# Patient Record
Sex: Female | Born: 1965 | Race: Black or African American | Hispanic: No | Smoking: Former smoker
Health system: Southern US, Community
[De-identification: ages and names within clinical notes are randomized; demographics above are authoritative.]

## PROBLEM LIST (undated history)

## (undated) DIAGNOSIS — Z9289 Personal history of other medical treatment: Secondary | ICD-10-CM

## (undated) DIAGNOSIS — J45909 Unspecified asthma, uncomplicated: Secondary | ICD-10-CM

## (undated) HISTORY — PX: COLONOSCOPY: SHX174

## (undated) HISTORY — PX: HEMORRHOID SURGERY: SHX153

## (undated) HISTORY — PX: TUBAL LIGATION: SHX77

## (undated) HISTORY — PX: ABDOMINAL HYSTERECTOMY: SHX81

---

## 2019-11-05 ENCOUNTER — Emergency Department
Admission: EM | Admit: 2019-11-05 | Discharge: 2019-11-05 | Disposition: A | Discharge status: Home / Self Care | Payer: Self-pay | Attending: Emergency Medicine | Admitting: Emergency Medicine

## 2019-11-05 ENCOUNTER — Other Ambulatory Visit: Payer: Self-pay

## 2019-11-05 ENCOUNTER — Encounter: Payer: Self-pay | Admitting: Emergency Medicine

## 2019-11-05 DIAGNOSIS — Z87891 Personal history of nicotine dependence: Secondary | ICD-10-CM | POA: Insufficient documentation

## 2019-11-05 DIAGNOSIS — R319 Hematuria, unspecified: Secondary | ICD-10-CM | POA: Insufficient documentation

## 2019-11-05 DIAGNOSIS — J45909 Unspecified asthma, uncomplicated: Secondary | ICD-10-CM | POA: Insufficient documentation

## 2019-11-05 HISTORY — DX: Unspecified asthma, uncomplicated: J45.909

## 2019-11-05 HISTORY — DX: Personal history of other medical treatment: Z92.89

## 2019-11-05 LAB — CBC
HCT: 37.8 % (ref 36.0–46.0)
Hemoglobin: 13.7 g/dL (ref 12.0–15.0)
MCH: 30.1 pg (ref 26.0–34.0)
MCHC: 36.2 g/dL — ABNORMAL HIGH (ref 30.0–36.0)
MCV: 83.1 fL (ref 80.0–100.0)
Platelets: 213 10*3/uL (ref 150–400)
RBC: 4.55 MIL/uL (ref 3.87–5.11)
RDW: 13.8 % (ref 11.5–15.5)
WBC: 4.5 10*3/uL (ref 4.0–10.5)
nRBC: 0 % (ref 0.0–0.2)

## 2019-11-05 LAB — URINALYSIS, COMPLETE (UACMP) WITH MICROSCOPIC
Bacteria, UA: NONE SEEN
Bilirubin Urine: NEGATIVE
Glucose, UA: NEGATIVE mg/dL
Ketones, ur: NEGATIVE mg/dL
Leukocytes,Ua: NEGATIVE
Nitrite: NEGATIVE
Protein, ur: 100 mg/dL — AB
RBC / HPF: >50 RBC/hpf — ABNORMAL HIGH (ref 0–5)
Specific Gravity, Urine: 1.032 — ABNORMAL HIGH (ref 1.005–1.030)
pH: 5 (ref 5.0–8.0)

## 2019-11-05 LAB — BASIC METABOLIC PANEL
Anion gap: 7 (ref 5–15)
BUN: 14 mg/dL (ref 6–20)
CO2: 31 mmol/L (ref 22–32)
Calcium: 9.3 mg/dL (ref 8.9–10.3)
Chloride: 104 mmol/L (ref 98–111)
Creatinine, Ser: 0.85 mg/dL (ref 0.44–1.00)
GFR calc Af Amer: >60 mL/min (ref >60)
GFR calc non Af Amer: >60 mL/min (ref >60)
Glucose, Bld: 124 mg/dL — ABNORMAL HIGH (ref 70–99)
Potassium: 4.1 mmol/L (ref 3.5–5.1)
Sodium: 142 mmol/L (ref 135–145)

## 2019-11-05 NOTE — Discharge Instructions (Addendum)
We suspect that the blood in the urine is due to a kidney stone, as you have no signs of a urinary tract infection.  Make sure you are drinking plenty of water, and take Tylenol or ibuprofen as needed for pain.  Return to the ER for new, worsening, or persistent severe abdominal or flank pain, fever or chills, vomiting, weakness, or if you have persistent bleeding.  If you have continued mild bleeding, you should follow-up with your primary care doctor within the next 1 to 2 weeks.

## 2019-11-05 NOTE — ED Provider Notes (Signed)
[facility] Emergency Department Provider Note ____________________________________________   First MD Initiated Contact with Patient 11/05/19 2114     (approximate)  I have reviewed the triage vital signs and the nursing notes.   HISTORY  Chief Complaint Hematuria    HPI Rebecca Mooney is a 54 y.o. female with PMH as noted below who presents with hematuria, acute onset yesterday, intermittent since then, and described as dark brown urine.  She also reports mild left flank pain since yesterday but has not needed to take anything for it.  She states that she has had kidney stones in the past, but also was concerned she could have a UTI.  She denies any urinary frequency, dysuria, or any fever or chills.  Past Medical History:  Diagnosis Date  . Asthma due to seasonal allergies   . History of blood transfusion     There are no problems to display for this patient.   Past Surgical History:  Procedure Laterality Date  . ABDOMINAL HYSTERECTOMY    . COLONOSCOPY    . HEMORRHOID SURGERY    . TUBAL LIGATION      Prior to Admission medications   Not on File    Allergies Patient has no known allergies.  History reviewed. No pertinent family history.  Social History Social History   Tobacco Use  . Smoking status: Former Games developer  . Smokeless tobacco: Never Used  Substance Use Topics  . Alcohol use: Yes    Comment: Occ wine  . Drug use: Never    Review of Systems  Constitutional: No fever/chills. Eyes: No visual changes. ENT: No sore throat. Cardiovascular: Denies chest pain. Respiratory: Denies shortness of breath. Gastrointestinal: No vomiting or diarrhea.  Genitourinary: Negative for dysuria.  Positive for hematuria. Musculoskeletal: Negative for back pain. Skin: Negative for rash. Neurological: Negative for headaches, focal weakness or numbness.   ____________________________________________   PHYSICAL EXAM:  VITAL SIGNS: ED  Triage Vitals  Enc Vitals Group     BP 11/05/19 1609 131/80     Pulse Rate 11/05/19 1609 83     Resp 11/05/19 1609 20     Temp 11/05/19 1609 98.4 F (36.9 C)     Temp Source 11/05/19 1609 Oral     SpO2 11/05/19 1609 97 %     Weight 11/05/19 1610 143 lb (64.9 kg)     Height 11/05/19 1610 4\' 11"  (1.499 m)     Head Circumference --      Peak Flow --      Pain Score 11/05/19 1610 3     Pain Loc --      Pain Edu? --      Excl. in GC? --     Constitutional: Alert and oriented. Well appearing and in no acute distress. Eyes: Conjunctivae are normal.  Head: Atraumatic. Nose: No congestion/rhinnorhea. Mouth/Throat: Mucous membranes are moist.   Neck: Normal range of motion.  Cardiovascular: Normal rate, regular rhythm. Good peripheral circulation. Respiratory: Normal respiratory effort.  No retractions.  Gastrointestinal: No distention.  Musculoskeletal: Extremities warm and well perfused.  Neurologic:  Normal speech and language. No gross focal neurologic deficits are appreciated.  Skin:  Skin is warm and dry. No rash noted. Psychiatric: Mood and affect are normal. Speech and behavior are normal.  ____________________________________________   LABS (all labs ordered are listed, but only abnormal results are displayed)  Labs Reviewed  URINALYSIS, COMPLETE (UACMP) WITH MICROSCOPIC - Abnormal; Notable for the following components:  Result Value   Color, Urine AMBER (*)    APPearance CLOUDY (*)    Specific Gravity, Urine 1.032 (*)    Hgb urine dipstick LARGE (*)    Protein, ur 100 (*)    RBC / HPF >50 (*)    All other components within normal limits  BASIC METABOLIC PANEL - Abnormal; Notable for the following components:   Glucose, Bld 124 (*)    All other components within normal limits  CBC - Abnormal; Notable for the following components:   MCHC 36.2 (*)    All other components within normal limits    ____________________________________________  EKG   ____________________________________________  RADIOLOGY    ____________________________________________   PROCEDURES  Procedure(s) performed: No  Procedures  Critical Care performed: No ____________________________________________   INITIAL IMPRESSION / ASSESSMENT AND PLAN / ED COURSE  Pertinent labs & imaging results that were available during my care of the patient were reviewed by me and considered in my medical decision making (see chart for details).  54 year old female with PMH as noted above including a prior history of kidney stones presents with gross hematuria since yesterday associated with mild left flank pain.  On exam she is overall very well-appearing.  Her vital signs are normal.  Physical exam is unremarkable.  Lab work-up obtained from triage is unremarkable except for UA showing RBCs.  There are no WBCs, bacteria, or other abnormal findings.  Overall presentation favors a kidney stone.  There is no clinical or lab evidence for UTI.  Given that the patient has had kidney stones previously and her pain is mild and not requiring any medication, there is also no indication for imaging.  I counseled the patient on the results of the work-up.  She feels comfortable and would like to go home.  Return precautions given, and she expresses understanding.  ____________________________________________   FINAL CLINICAL IMPRESSION(S) / ED DIAGNOSES  Final diagnoses:  Hematuria, unspecified type      NEW MEDICATIONS STARTED DURING THIS VISIT:  There are no discharge medications for this patient.    Note:  This document was prepared using Dragon voice recognition software and may include unintentional dictation errors.   Dionne Bucy, MD 11/05/19 2215

## 2019-11-05 NOTE — ED Triage Notes (Signed)
Pt presents to ED via POV with c/o hematuria. Pt states saw dark brown urine yesterday, states some today but not as bad. Pt states mild pain to R flank at this time.

## 2019-11-16 ENCOUNTER — Emergency Department: Payer: Medicaid Other

## 2019-11-16 ENCOUNTER — Emergency Department
Admission: EM | Admit: 2019-11-16 | Discharge: 2019-11-16 | Disposition: A | Discharge status: Home / Self Care | Payer: Medicaid Other | Attending: Student in an Organized Health Care Education/Training Program | Admitting: Student in an Organized Health Care Education/Training Program

## 2019-11-16 ENCOUNTER — Other Ambulatory Visit: Payer: Self-pay

## 2019-11-16 DIAGNOSIS — N200 Calculus of kidney: Secondary | ICD-10-CM | POA: Insufficient documentation

## 2019-11-16 DIAGNOSIS — J45909 Unspecified asthma, uncomplicated: Secondary | ICD-10-CM | POA: Insufficient documentation

## 2019-11-16 DIAGNOSIS — R109 Unspecified abdominal pain: Secondary | ICD-10-CM

## 2019-11-16 DIAGNOSIS — Z87891 Personal history of nicotine dependence: Secondary | ICD-10-CM | POA: Insufficient documentation

## 2019-11-16 LAB — URINALYSIS, COMPLETE (UACMP) WITH MICROSCOPIC
Bacteria, UA: NONE SEEN
Bilirubin Urine: NEGATIVE
Glucose, UA: 50 mg/dL — AB
Hgb urine dipstick: NEGATIVE
Ketones, ur: NEGATIVE mg/dL
Leukocytes,Ua: NEGATIVE
Nitrite: NEGATIVE
Protein, ur: NEGATIVE mg/dL
Specific Gravity, Urine: 1.015 (ref 1.005–1.030)
pH: 5 (ref 5.0–8.0)

## 2019-11-16 LAB — CBC
HCT: 40.6 % (ref 36.0–46.0)
Hemoglobin: 14.2 g/dL (ref 12.0–15.0)
MCH: 29.8 pg (ref 26.0–34.0)
MCHC: 35 g/dL (ref 30.0–36.0)
MCV: 85.1 fL (ref 80.0–100.0)
Platelets: 251 10*3/uL (ref 150–400)
RBC: 4.77 MIL/uL (ref 3.87–5.11)
RDW: 13.7 % (ref 11.5–15.5)
WBC: 4 10*3/uL (ref 4.0–10.5)
nRBC: 0 % (ref 0.0–0.2)

## 2019-11-16 LAB — BASIC METABOLIC PANEL
Anion gap: 10 (ref 5–15)
BUN: 17 mg/dL (ref 6–20)
CO2: 24 mmol/L (ref 22–32)
Calcium: 9.7 mg/dL (ref 8.9–10.3)
Chloride: 101 mmol/L (ref 98–111)
Creatinine, Ser: 0.84 mg/dL (ref 0.44–1.00)
GFR calc Af Amer: >60 mL/min (ref >60)
GFR calc non Af Amer: >60 mL/min (ref >60)
Glucose, Bld: 128 mg/dL — ABNORMAL HIGH (ref 70–99)
Potassium: 4.3 mmol/L (ref 3.5–5.1)
Sodium: 135 mmol/L (ref 135–145)

## 2019-11-16 MED ORDER — MORPHINE SULFATE (PF) 4 MG/ML IV SOLN
4.0000 mg | INTRAVENOUS | Status: DC | PRN
  Administered 2019-11-16: 4 mg via INTRAVENOUS
  Filled 2019-11-16: qty 1

## 2019-11-16 MED ORDER — KETOROLAC TROMETHAMINE 30 MG/ML IJ SOLN
15.0000 mg | Freq: Once | INTRAMUSCULAR | Status: AC
  Administered 2019-11-16: 15 mg via INTRAVENOUS
  Filled 2019-11-16: qty 1

## 2019-11-16 MED ORDER — ONDANSETRON HCL 4 MG/2ML IJ SOLN
4.0000 mg | Freq: Once | INTRAMUSCULAR | Status: AC
  Administered 2019-11-16: 4 mg via INTRAVENOUS
  Filled 2019-11-16: qty 2

## 2019-11-16 MED ORDER — HYDROCODONE-ACETAMINOPHEN 5-325 MG PO TABS: 1.0000 | ORAL_TABLET | ORAL | 0 refills | Status: AC | PRN

## 2019-11-16 MED ORDER — ONDANSETRON HCL 4 MG PO TABS: 4.0000 mg | ORAL_TABLET | Freq: Every day | ORAL | 0 refills | Status: AC | PRN

## 2019-11-16 MED ORDER — TAMSULOSIN HCL 0.4 MG PO CAPS: 0.4000 mg | ORAL_CAPSULE | Freq: Every day | ORAL | 0 refills | Status: AC

## 2019-11-16 NOTE — ED Provider Notes (Signed)
[facility] Emergency Department Provider Note    First MD Initiated Contact with Patient 11/16/19 1659     (approximate)  I have reviewed the triage vital signs and the nursing notes.   HISTORY  Chief Complaint Flank Pain    HPI Shernell Saldierna is a 54 y.o. female with a history of kidney stones presents to the ER for evaluation of acute onset of right flank pain radiating to her groin.  States she was having some hematuria earlier but is since cleared.  Denies any fevers.  Is having nausea.  Rates the pain is moderate to severe.    Past Medical History:  Diagnosis Date  . Asthma due to seasonal allergies   . History of blood transfusion    No family history on file. Past Surgical History:  Procedure Laterality Date  . ABDOMINAL HYSTERECTOMY    . COLONOSCOPY    . HEMORRHOID SURGERY    . TUBAL LIGATION     There are no problems to display for this patient.     Prior to Admission medications   Medication Sig Start Date End Date Taking? Authorizing Provider  HYDROcodone-acetaminophen (NORCO) 5-325 MG tablet Take 1 tablet by mouth every 4 (four) hours as needed for moderate pain. 11/16/19   Willy Eddy, MD  ondansetron (ZOFRAN) 4 MG tablet Take 1 tablet (4 mg total) by mouth daily as needed. 11/16/19 11/15/20  Willy Eddy, MD  tamsulosin (FLOMAX) 0.4 MG CAPS capsule Take 1 capsule (0.4 mg total) by mouth daily after supper. 11/16/19   Willy Eddy, MD    Allergies Patient has no known allergies.    Social History Social History   Tobacco Use  . Smoking status: Former Games developer  . Smokeless tobacco: Never Used  Substance Use Topics  . Alcohol use: Yes    Comment: Occ wine  . Drug use: Never    Review of Systems Patient denies headaches, rhinorrhea, blurry vision, numbness, shortness of breath, chest pain, edema, cough, abdominal pain, nausea, vomiting, diarrhea, dysuria, fevers, rashes or hallucinations unless otherwise  stated above in HPI. ____________________________________________   PHYSICAL EXAM:  VITAL SIGNS: Vitals:   11/16/19 1348  BP: (!) 127/91  Pulse: 82  Resp: 18  Temp: 98.6 F (37 C)  SpO2: 100%    Constitutional: Alert and oriented.  Eyes: Conjunctivae are normal.  Head: Atraumatic. Nose: No congestion/rhinnorhea. Mouth/Throat: Mucous membranes are moist.   Neck: No stridor. Painless ROM.  Cardiovascular: Normal rate, regular rhythm. Grossly normal heart sounds.  Good peripheral circulation. Respiratory: Normal respiratory effort.  No retractions. Lungs CTAB. Gastrointestinal: Soft and nontender. No distention. No abdominal bruits. No CVA tenderness. Genitourinary:  Musculoskeletal: No lower extremity tenderness nor edema.  No joint effusions. Neurologic:  Normal speech and language. No gross focal neurologic deficits are appreciated. No facial droop Skin:  Skin is warm, dry and intact. No rash noted. Psychiatric: Mood and affect are normal. Speech and behavior are normal.  ____________________________________________   LABS (all labs ordered are listed, but only abnormal results are displayed)  Results for orders placed or performed during the hospital encounter of 11/16/19 (from the past 24 hour(s))  Urinalysis, Complete w Microscopic     Status: Abnormal   Collection Time: 11/16/19  1:55 PM  Result Value Ref Range   Color, Urine YELLOW (A) YELLOW   APPearance HAZY (A) CLEAR   Specific Gravity, Urine 1.015 1.005 - 1.030   pH 5.0 5.0 - 8.0   Glucose, UA 50 (  A) NEGATIVE mg/dL   Hgb urine dipstick NEGATIVE NEGATIVE   Bilirubin Urine NEGATIVE NEGATIVE   Ketones, ur NEGATIVE NEGATIVE mg/dL   Protein, ur NEGATIVE NEGATIVE mg/dL   Nitrite NEGATIVE NEGATIVE   Leukocytes,Ua NEGATIVE NEGATIVE   RBC / HPF 0-5 0 - 5 RBC/hpf   WBC, UA 0-5 0 - 5 WBC/hpf   Bacteria, UA NONE SEEN NONE SEEN   Squamous Epithelial / LPF 0-5 0 - 5   Mucus PRESENT   Basic metabolic panel      Status: Abnormal   Collection Time: 11/16/19  1:55 PM  Result Value Ref Range   Sodium 135 135 - 145 mmol/L   Potassium 4.3 3.5 - 5.1 mmol/L   Chloride 101 98 - 111 mmol/L   CO2 24 22 - 32 mmol/L   Glucose, Bld 128 (H) 70 - 99 mg/dL   BUN 17 6 - 20 mg/dL   Creatinine, Ser 4.40 0.44 - 1.00 mg/dL   Calcium 9.7 8.9 - 34.7 mg/dL   GFR calc non Af Amer >60 >60 mL/min   GFR calc Af Amer >60 >60 mL/min   Anion gap 10 5 - 15  CBC     Status: None   Collection Time: 11/16/19  1:55 PM  Result Value Ref Range   WBC 4.0 4.0 - 10.5 K/uL   RBC 4.77 3.87 - 5.11 MIL/uL   Hemoglobin 14.2 12.0 - 15.0 g/dL   HCT 42.5 36 - 46 %   MCV 85.1 80.0 - 100.0 fL   MCH 29.8 26.0 - 34.0 pg   MCHC 35.0 30.0 - 36.0 g/dL   RDW 95.6 38.7 - 56.4 %   Platelets 251 150 - 400 K/uL   nRBC 0.0 0.0 - 0.2 %   ____________________________________________ ____________________________________________  RADIOLOGY  I personally reviewed all radiographic images ordered to evaluate for the above acute complaints and reviewed radiology reports and findings.  These findings were personally discussed with the patient.  Please see medical record for radiology report.  ____________________________________________   PROCEDURES  Procedure(s) performed:  Procedures    Critical Care performed: no ____________________________________________   INITIAL IMPRESSION / ASSESSMENT AND PLAN / ED COURSE  Pertinent labs & imaging results that were available during my care of the patient were reviewed by me and considered in my medical decision making (see chart for details).   DDX: Stone, Pilo, diverticulitis, colitis, appendicitis, ovarian pathology, musculoskeletal strain, hernia  Satine Hausner is a 54 y.o. who presents to the ED with right flank pain in setting of history of kidney stones.  She is afebrile does appear uncomfortable.  Seems less consistent with appendicitis or colitis.  Given her history CT imaging will be  ordered.  Will provide IV narcotic as well as IV Toradol and IV fluids.  Clinical Course as of Nov 16 1815  Sun Nov 16, 2019  1815 Patient reassessed.  Exam work-up consistent with right ureterolithiasis.  No signs of sepsis.  No sirs criteria.  Urine does not appear infected.  No significant hematuria today but reports having hematuria over the past 24 hours therefore I think this is consistent with a small stone.  Pain improved with IV narcotic medication as well as IV Toradol.  She is tolerating p.o.  At this point he believe she stable and appropriate for outpatient follow-up.   [PR]    Clinical Course User Index [PR] Willy Eddy, MD    The patient was evaluated in Emergency Department today for the symptoms described  in the history of present illness. He/she was evaluated in the context of the global COVID-19 pandemic, which necessitated consideration that the patient might be at risk for infection with the SARS-CoV-2 virus that causes COVID-19. Institutional protocols and algorithms that pertain to the evaluation of patients at risk for COVID-19 are in a state of rapid change based on information released by regulatory bodies including the CDC and federal and state organizations. These policies and algorithms were followed during the patient's care in the ED.  As part of my medical decision making, I reviewed the following data within the electronic MEDICAL RECORD NUMBER Nursing notes reviewed and incorporated, Labs reviewed, notes from prior ED visits and [place] Controlled Substance Database   ____________________________________________   FINAL CLINICAL IMPRESSION(S) / ED DIAGNOSES  Final diagnoses:  Right flank pain  Kidney stone      NEW MEDICATIONS STARTED DURING THIS VISIT:  New Prescriptions   HYDROCODONE-ACETAMINOPHEN (NORCO) 5-325 MG TABLET    Take 1 tablet by mouth every 4 (four) hours as needed for moderate pain.   ONDANSETRON (ZOFRAN) 4 MG TABLET    Take 1 tablet (4 mg  total) by mouth daily as needed.   TAMSULOSIN (FLOMAX) 0.4 MG CAPS CAPSULE    Take 1 capsule (0.4 mg total) by mouth daily after supper.     Note:  This document was prepared using Dragon voice recognition software and may include unintentional dictation errors.    Willy Eddy, MD 11/16/19 [phone]

## 2019-11-16 NOTE — ED Triage Notes (Signed)
Pt states that she started having sharp shooting right sided flank pain a couple hours ago, states that she has had kidney stones in the past and recently was seen for having blood in her urine, denies seeing blood at this time

## 2019-11-16 NOTE — ED Notes (Addendum)
Pt presents to the ED for R-sided flank pain that started this am around 1100. Denies fever or diarrhea. Pt states she has been nauseous and vomited x1 today. Pt has a hx of kidney stones pt states she had approx 3 years ago. Pt is A&Ox4 but does seem uncomfortable due to the pain.

## 2021-11-09 IMAGING — CT CT RENAL STONE PROTOCOL
2 of 4 series · 16 of 46 positions shown, 18 images · non-contrast
Comparison: None

CLINICAL DATA: Onset of sharp shooting RIGHT-sided flank pain a
couple hours ago, history of kidney stones, recently seen for having
blood in urine but denies visible hematuria at present

EXAM:
CT ABDOMEN AND PELVIS WITHOUT CONTRAST
TECHNIQUE: Multidetector CT imaging of the abdomen and pelvis was performed
following the standard protocol without IV contrast. Sagittal and
coronal MPR images reconstructed from axial data set. No oral
contrast administered.

[Series 2: stone full standard · axial · 0.76mm/px · z∈[-823,-448]mm · 13 of 83 slices shown, 15 images]
[im 4/83  soft-tissue]
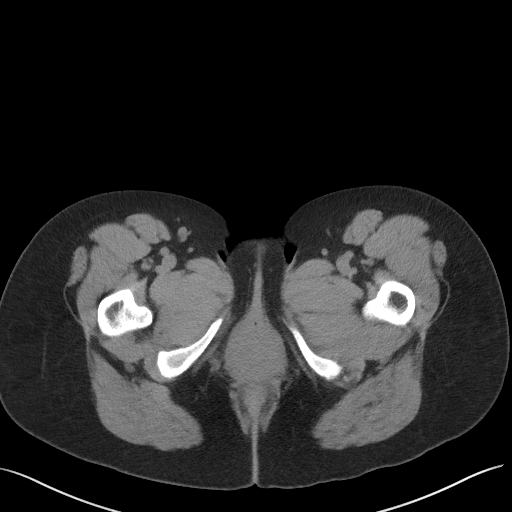
[im 4/83  bone]
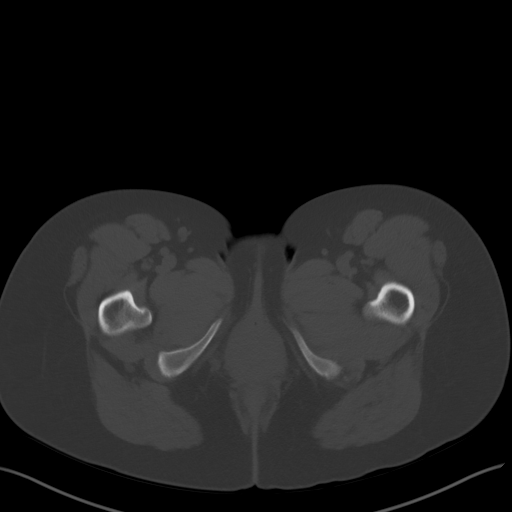
[im 10/83  soft-tissue]
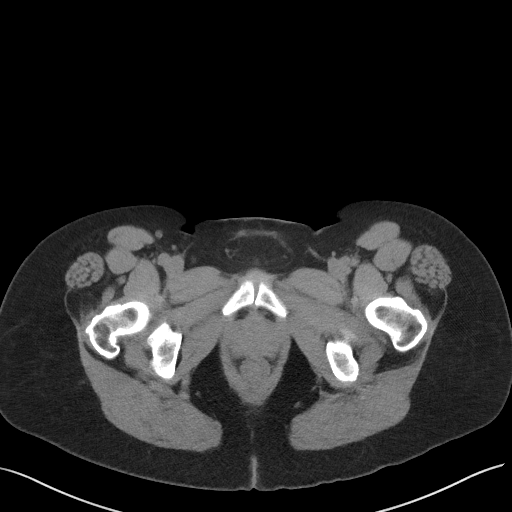
[im 17/83  soft-tissue]
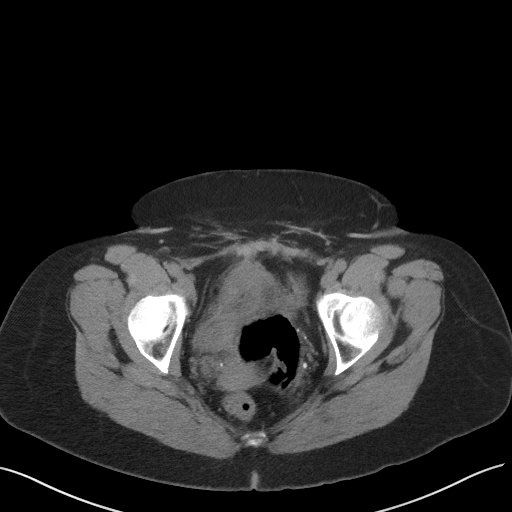
[im 23/83  soft-tissue]
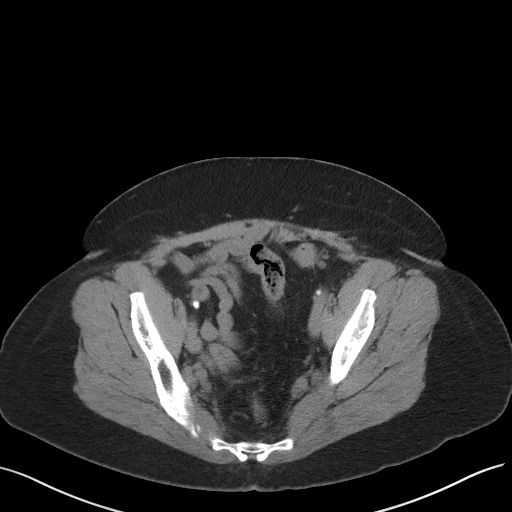
[im 30/83  soft-tissue]
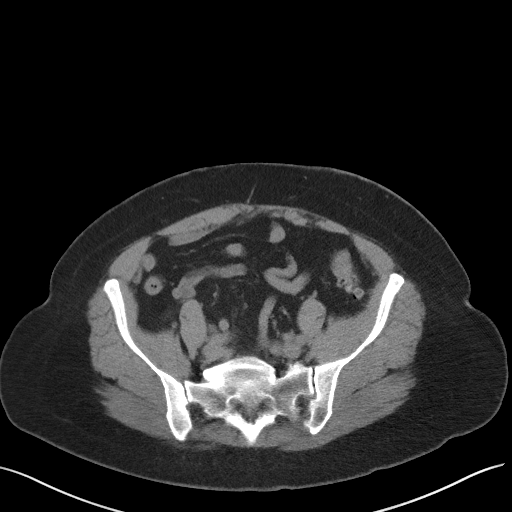
[im 37/83  soft-tissue]
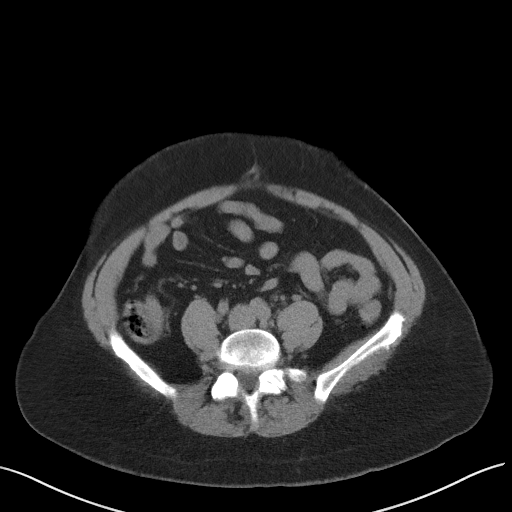
[im 43/83  soft-tissue]
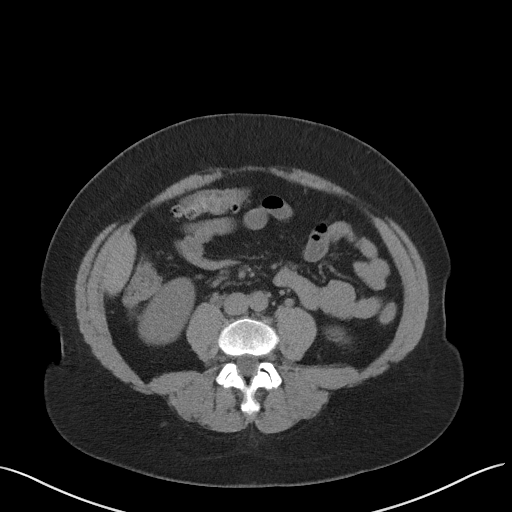
[im 46/83  soft-tissue]
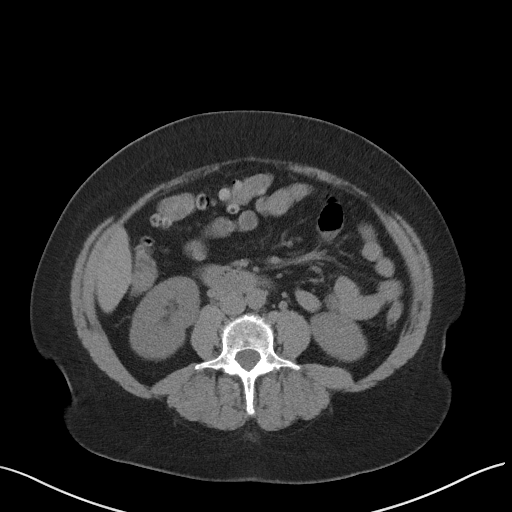
[im 53/83  soft-tissue]
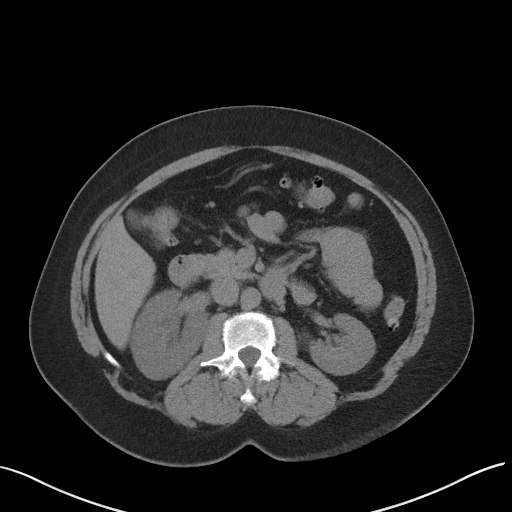
[im 53/83  bone]
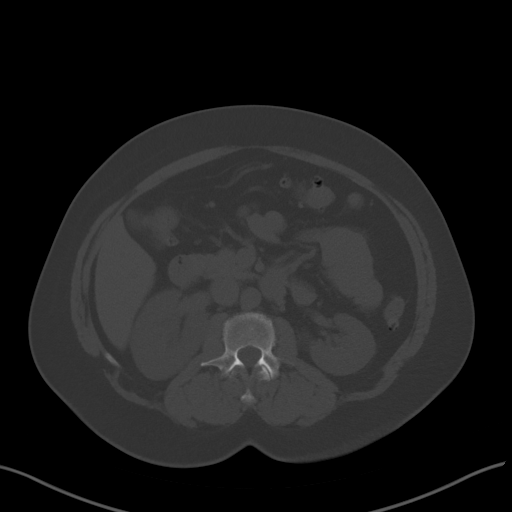
[im 60/83  soft-tissue]
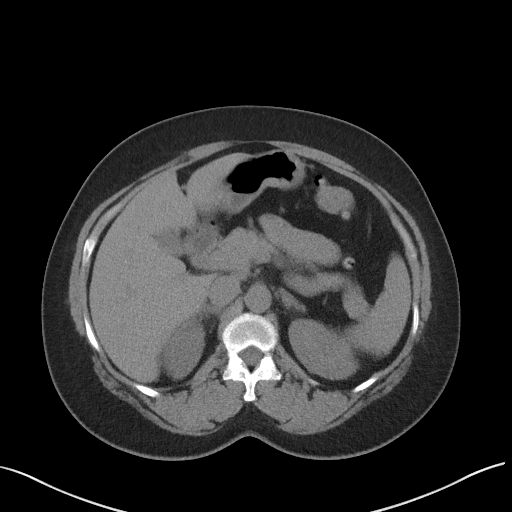
[im 66/83  soft-tissue]
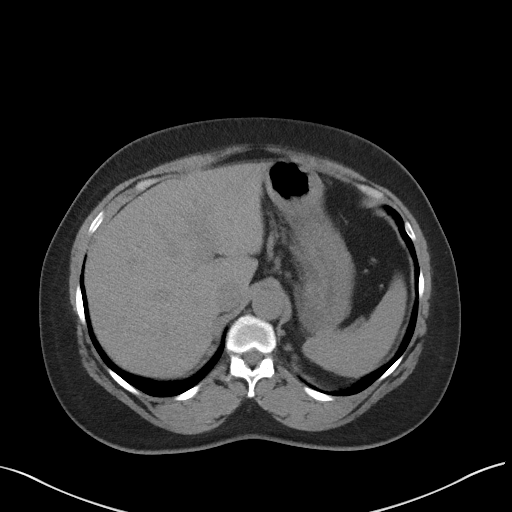
[im 73/83  soft-tissue]
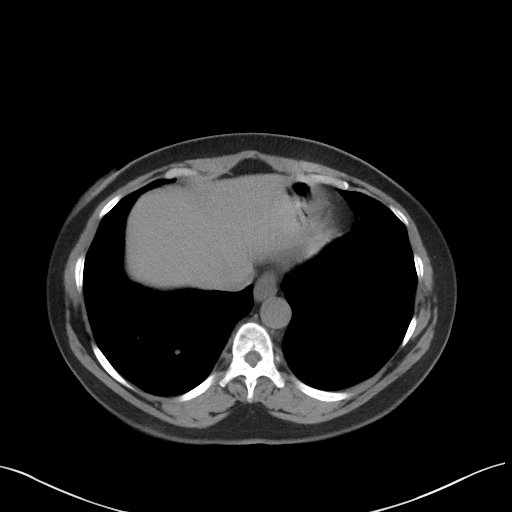
[im 79/83  soft-tissue]
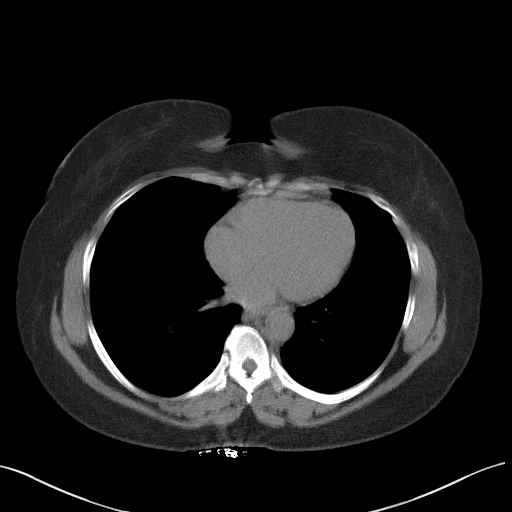

[Series 5: coronal · coronal · 0.64mm/px · 3 of 127 slices shown]
[im 43/127  soft-tissue]
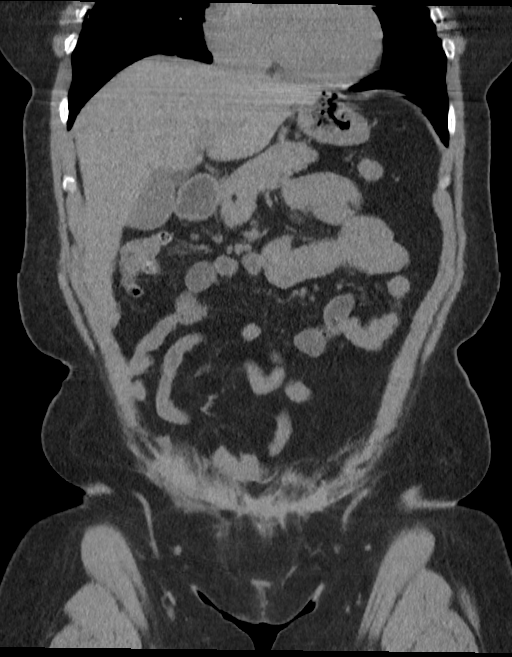
[im 57/127  soft-tissue]
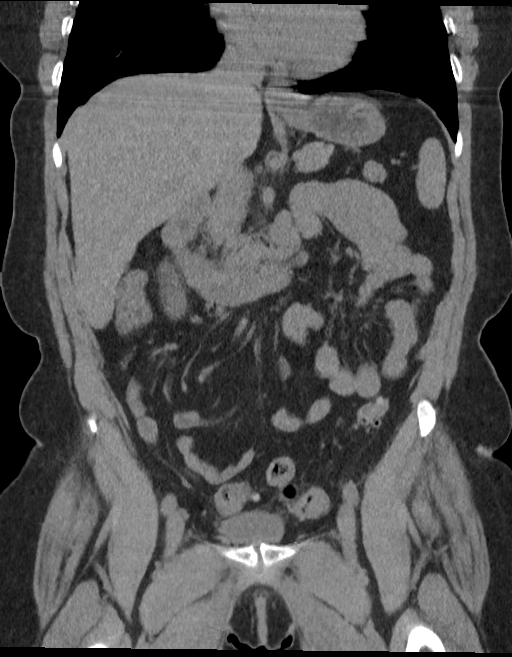
[im 71/127  soft-tissue]
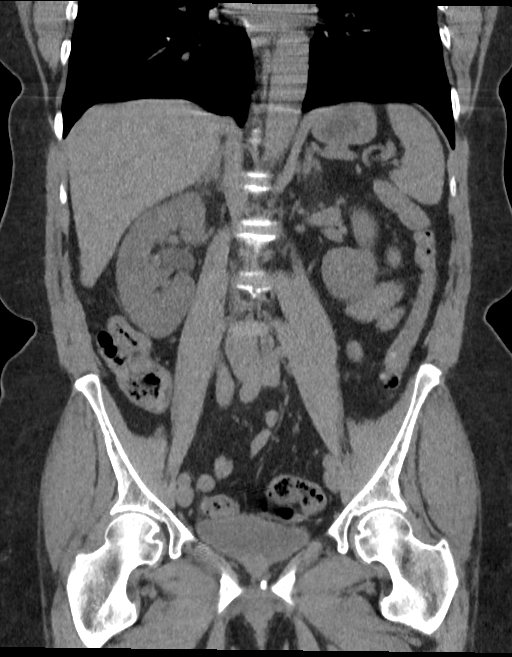

[16 of 46 positions shown; findings below may reference images not displayed]

FINDINGS: Lower chest: Lung bases clear

Hepatobiliary: Gallbladder and liver normal appearance

Pancreas: Normal appearance

Spleen: Normal appearance

Adrenals/Urinary Tract: Adrenal glands and LEFT kidney normal
appearance. Tiny nonobstructing calculus at upper pole RIGHT kidney.
Mild RIGHT hydronephrosis and hydroureter. Distal RIGHT ureter is
difficult to localize. Questionable small calculus at the distal
RIGHT ureter just above the ureterovesical junction versus a
phlebolith. No LEFT hydronephrosis or hydroureter. No renal masses.

Stomach/Bowel: Normal appendix. Scattered diverticulosis of colon
without evidence of diverticulitis. Stomach and bowel loops
otherwise normal appearance.

Vascular/Lymphatic: Aorta normal caliber. No adenopathy. Numerous
pelvic phleboliths.

Reproductive: Uterus surgically absent. Questionable visualization
of an atrophic RIGHT ovary.

Other: No free air or free fluid.  No hernia.

Musculoskeletal: Unremarkable
IMPRESSION: Tiny BILATERAL nonobstructing RIGHT renal calculus.

Mild RIGHT hydronephrosis and hydroureter, with suboptimal
delineation of the distal RIGHT ureter; potential small distal RIGHT
ureteral calculus just above ureterovesical junction versus adjacent
phlebolith, recommend correlation with urinalysis.

Diffuse colonic diverticulosis without evidence of diverticulitis.
